# Patient Record
Sex: Male | Born: 1990 | Race: White | Hispanic: No | Marital: Single | Smoking: Never smoker
Health system: Southern US, Community
[De-identification: ages and names within clinical notes are randomized; demographics above are authoritative.]

## PROBLEM LIST (undated history)

## (undated) DIAGNOSIS — I10 Essential (primary) hypertension: Secondary | ICD-10-CM

## (undated) HISTORY — PX: APPENDECTOMY: SHX54

---
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
## 2012-02-06 ENCOUNTER — Emergency Department (HOSPITAL_BASED_OUTPATIENT_CLINIC_OR_DEPARTMENT_OTHER)
Admission: EM | Admit: 2012-02-06 | Discharge: 2012-02-06 | Disposition: A | Discharge status: Home / Self Care | Payer: PRIVATE HEALTH INSURANCE | Attending: Emergency Medicine | Admitting: Emergency Medicine

## 2012-02-06 ENCOUNTER — Encounter (HOSPITAL_BASED_OUTPATIENT_CLINIC_OR_DEPARTMENT_OTHER): Payer: Self-pay | Admitting: *Deleted

## 2012-02-06 DIAGNOSIS — T1590XA Foreign body on external eye, part unspecified, unspecified eye, initial encounter: Secondary | ICD-10-CM | POA: Insufficient documentation

## 2012-02-06 DIAGNOSIS — Z9089 Acquired absence of other organs: Secondary | ICD-10-CM | POA: Insufficient documentation

## 2012-02-06 DIAGNOSIS — S0500XA Injury of conjunctiva and corneal abrasion without foreign body, unspecified eye, initial encounter: Secondary | ICD-10-CM

## 2012-02-06 DIAGNOSIS — S058X9A Other injuries of unspecified eye and orbit, initial encounter: Secondary | ICD-10-CM | POA: Insufficient documentation

## 2012-02-06 DIAGNOSIS — Y9289 Other specified places as the place of occurrence of the external cause: Secondary | ICD-10-CM | POA: Insufficient documentation

## 2012-02-06 MED ORDER — OXYCODONE-ACETAMINOPHEN 5-325 MG PO TABS
2.0000 | ORAL_TABLET | Freq: Once | ORAL | Status: AC
  Administered 2012-02-06: 2 via ORAL
  Filled 2012-02-06 (×2): qty 2

## 2012-02-06 MED ORDER — TETRACAINE HCL 0.5 % OP SOLN
2.0000 [drp] | Freq: Once | OPHTHALMIC | Status: AC
  Administered 2012-02-06: 2 [drp] via OPHTHALMIC
  Filled 2012-02-06: qty 2

## 2012-02-06 MED ORDER — FLUORESCEIN SODIUM 1 MG OP STRP
1.0000 | ORAL_STRIP | Freq: Once | OPHTHALMIC | Status: AC
  Administered 2012-02-06: 1 via OPHTHALMIC
  Filled 2012-02-06 (×2): qty 1

## 2012-02-06 MED ORDER — OXYCODONE-ACETAMINOPHEN 5-325 MG PO TABS: 1.0000 | ORAL_TABLET | Freq: Four times a day (QID) | ORAL | Status: DC | PRN

## 2012-02-06 MED ORDER — CIPROFLOXACIN HCL 0.3 % OP SOLN
2.0000 [drp] | OPHTHALMIC | Status: DC
  Administered 2012-02-06: 2 [drp] via OPHTHALMIC

## 2012-02-06 MED ORDER — CIPROFLOXACIN HCL 0.3 % OP SOLN
OPHTHALMIC | Status: AC
  Filled 2012-02-06: qty 2.5

## 2012-02-06 NOTE — ED Notes (Signed)
Pt states he has a piece of glass or plastic in his left eye.

## 2012-02-06 NOTE — ED Provider Notes (Signed)
History  This chart was scribed for Edwin B. Bernette Mayers, MD by Shari Heritage. The patient was seen in room MH12/MH12. Patient's care was started at 2034.     CSN: [id number]  Arrival date & time 02/06/12  1958   First MD Initiated Contact with Patient 02/06/12 2034      Chief Complaint  Patient presents with  . Foreign Body in Eye     The history is provided by the patient. No language interpreter was used.    Edwin Holland is a 21 y.o. male who presents to the Emergency Department complaining of moderate, constant, non-radiating  left eye pain onset this morning when he woke up. 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 Patient states that there may be something in his eye. Patient also states that he may have been exposed to chemicals at work. Patient denies right eye pain or any other symptoms. Patient states that he and took some Ibuprofen a couple of hours ago and also washed his eye out with OTC solution before coming to the ED. He reports no significant past medical history.    History reviewed. No pertinent past medical history.  Past Surgical History  Procedure Date  . Appendectomy     History reviewed. No pertinent family history.  History  Substance Use Topics  . Smoking status: Never Smoker   . Smokeless tobacco: Not on file  . Alcohol Use: No      Review of Systems A complete 10 system review of systems was obtained and all systems are negative except as noted in the HPI and PMH.   Allergies  Review of patient's allergies indicates no known allergies.  Home Medications  No current outpatient prescriptions on file.  BP 144/80  Pulse 56  Temp 97.9 F (36.6 C) (Oral)  Resp 18  Ht 5\' 10"  (1.778 m)  Wt 223 lb (101.152 kg)  BMI 32.00 kg/m2  SpO2 100%  Physical Exam  Constitutional: He is oriented to person, place, and time. He appears well-developed and well-nourished.  HENT:  Head: Normocephalic and atraumatic.    Eyes: No foreign body present in the left eye. Left conjunctiva is injected.  Slit lamp exam:      The left eye shows corneal abrasion.       No foreign body seen on lid eversion of left eyelid. Diffuse punctate corneal abrasions to the left eye. Left conjunctival injection present.  Neck: Neck supple.  Pulmonary/Chest: Effort normal.  Neurological: He is alert and oriented to person, place, and time. No cranial nerve deficit.  Psychiatric: He has a normal mood and affect. His behavior is normal.    ED Course  Procedures (including critical care time) DIAGNOSTIC STUDIES: Oxygen Saturation is 100% on room air, normal by my interpretation.    COORDINATION OF CARE: 8:43pm- Patient informed of current plan for treatment and evaluation and agrees with plan at this time.      Labs Reviewed - No data to display No results found.   No diagnosis found.    MDM  No FB seen but several corneal abrasions seen. Treat with Abx and pain meds. Ophtho referral.      I personally performed the services described in the documentation, which were scribed in my presence. The recorded information has been reviewed and considered.     Edwin B. Bernette Mayers, MD 02/06/12 2340

## 2012-02-06 NOTE — ED Notes (Signed)
Patient states he went to sleep last night with no trouble, awoke today with object in eye, that it may have fallen from his hair.

## 2012-02-06 NOTE — ED Notes (Signed)
Patient unable to open eyes for visual acuity at present.

## 2021-04-28 ENCOUNTER — Ambulatory Visit: Payer: Self-pay | Admitting: *Deleted

## 2021-04-28 NOTE — Telephone Encounter (Signed)
I returned pt's call.   He called in earlier today.  He is a Microbiologist and moves around a lot.  His current PCP may not give another supply of Losartan 50 mg daily because he has already been given 2 90 day supplies.   He is in [place] for 5 months.   Should he schedule a new pt appt or can he go to the urgent care and they prescribe the Losartan.   He only has 14 pills left.  He moved 6 months ago to [place].     I move constantly.   I live in different counties.  His PCP in in [place].  He won't be back in [facility] until May of 2025.  I suggested he call his PCP that has been ordering the Losartan and see if he will prescribe him another 90 days and if not then go to the urgent care.   "I know I have a weird and different situation".      Reason for Disposition  Health Information question, no triage required and triager able to answer question  Answer Assessment - Initial Assessment Questions 1. REASON FOR CALL or QUESTION: "What is your reason for calling today?" or "How can I best help you?" or "What question do you have that I can help answer?"     See documentation notes  Protocols used: Information Only Call - No Triage-A-AH

## 2021-05-01 ENCOUNTER — Other Ambulatory Visit: Payer: Self-pay

## 2021-05-01 ENCOUNTER — Emergency Department: Payer: PRIVATE HEALTH INSURANCE

## 2021-05-01 ENCOUNTER — Emergency Department
Admission: EM | Admit: 2021-05-01 | Discharge: 2021-05-02 | Disposition: A | Discharge status: Home / Self Care | Payer: PRIVATE HEALTH INSURANCE | Attending: Emergency Medicine | Admitting: Emergency Medicine

## 2021-05-01 DIAGNOSIS — I10 Essential (primary) hypertension: Secondary | ICD-10-CM | POA: Insufficient documentation

## 2021-05-01 DIAGNOSIS — Z79899 Other long term (current) drug therapy: Secondary | ICD-10-CM | POA: Insufficient documentation

## 2021-05-01 DIAGNOSIS — R002 Palpitations: Secondary | ICD-10-CM | POA: Insufficient documentation

## 2021-05-01 DIAGNOSIS — R0789 Other chest pain: Secondary | ICD-10-CM | POA: Insufficient documentation

## 2021-05-01 LAB — BASIC METABOLIC PANEL
Anion gap: 12 (ref 5–15)
BUN: 16 mg/dL (ref 6–20)
CO2: 23 mmol/L (ref 22–32)
Calcium: 9.4 mg/dL (ref 8.9–10.3)
Chloride: 102 mmol/L (ref 98–111)
Creatinine, Ser: 0.97 mg/dL (ref 0.61–1.24)
GFR, Estimated: >60 mL/min (ref >60)
Glucose, Bld: 112 mg/dL — ABNORMAL HIGH (ref 70–99)
Potassium: 3.5 mmol/L (ref 3.5–5.1)
Sodium: 137 mmol/L (ref 135–145)

## 2021-05-01 LAB — CBC
HCT: 44.2 % (ref 39.0–52.0)
Hemoglobin: 14.6 g/dL (ref 13.0–17.0)
MCH: 27.7 pg (ref 26.0–34.0)
MCHC: 33 g/dL (ref 30.0–36.0)
MCV: 83.7 fL (ref 80.0–100.0)
Platelets: 274 10*3/uL (ref 150–400)
RBC: 5.28 MIL/uL (ref 4.22–5.81)
RDW: 13 % (ref 11.5–15.5)
WBC: 7.4 10*3/uL (ref 4.0–10.5)
nRBC: 0 % (ref 0.0–0.2)

## 2021-05-01 LAB — MAGNESIUM: Magnesium: 2.1 mg/dL (ref 1.7–2.4)

## 2021-05-01 LAB — TROPONIN I (HIGH SENSITIVITY): Troponin I (High Sensitivity): 3 ng/L (ref <18)

## 2021-05-01 LAB — TSH: TSH: 5.448 u[IU]/mL — ABNORMAL HIGH (ref 0.350–4.500)

## 2021-05-01 LAB — T4, FREE: Free T4: 1.03 ng/dL (ref 0.61–1.12)

## 2021-05-01 NOTE — ED Provider Triage Note (Signed)
Emergency Medicine Provider Triage Evaluation Note  Edwin Holland , a 31 y.o. male  was evaluated in triage.  Pt complains of hypertension and tachycardia intermittent for the past 2 weeks. Symptoms worsen when on social media and hearing of young people dying from cardiac issues. He denies cardiac history.  Review of Systems  Positive: Palpitations  Negative: Shortness of breath  Physical Exam  There were no vitals taken for this visit. Gen:   Awake, no distress   Resp:  Normal effort  MSK:   Moves extremities without difficulty  Other:    Medical Decision Making  Medically screening exam initiated at 2:50 PM.  Appropriate orders placed.  Edwin Holland was informed that the remainder of the evaluation will be completed by another provider, this initial triage assessment does not replace that evaluation, and the importance of remaining in the ED until their evaluation is complete.    Chinita Pester, FNP 05/01/21 1554

## 2021-05-01 NOTE — ED Triage Notes (Signed)
Pt states that he has been having racing heart rate for the past couple weeks, states that he has become more anxious with seeing all the young people dying recently and is concerned about his bp and his elevated heart rate. Pt states that is having different sensations around his heart and some center back pain and the feeling to stretch his chest out

## 2021-05-01 NOTE — Discharge Instructions (Addendum)
Steps to find a Primary Care Provider (PCP):  Call [phone] or [phone] to access "[place] Find a Doctor Service."  2.  You may also go on the [place] website at [email or web address]  

## 2021-05-01 NOTE — ED Provider Notes (Signed)
[facility] Provider Note    Event Date/Time   First MD Initiated Contact with Patient 05/01/21 2340     (approximate)   History   No chief complaint on file.   HPI  Edwin Holland is a 31 y.o. male with history of hypertension on losartan who presents to the emergency department with intermittent left-sided chest tightness that has been ongoing for the past several months.  He also reports intermittent palpitations and feels like his heart is racing.  He states he has a twin brother who has had identical symptoms.  He states at times he will feel short of breath.  He does not notice any aggravating or alleviating factors.  He denies any recent fevers, cough, vomiting, diarrhea, bloody stools or melena.  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  He does have an appointment with a new primary care doctor on March 2.  Has never seen a cardiologist.  No history of PE, DVT, exogenous estrogen use, recent fractures, surgery, trauma, hospitalization, prolonged travel or other immobilization. No lower extremity swelling or pain. No calf tenderness.  History provided by patient.       No past medical history on file.  Past Surgical History:  Procedure Laterality Date   APPENDECTOMY      MEDICATIONS:  Prior to Admission medications   Medication Sig Start Date End Date Taking? Authorizing Provider  oxyCODONE-acetaminophen (PERCOCET/ROXICET) 5-325 MG per tablet Take 1-2 tablets by mouth every 6 (six) hours as needed for pain. 02/06/12   Pollyann Savoy, MD    Physical Exam   Triage Vital Signs: ED Triage Vitals  Enc Vitals Group     BP 05/01/21 1455 (!) 136/102     Pulse Rate 05/01/21 1455 (!) 118     Resp 05/01/21 1455 16     Temp 05/01/21 1455 98.7 F (37.1 C)     Temp Source 05/01/21 1455 Oral     SpO2 05/01/21 1455 100 %     Weight  05/01/21 1502 281 lb (127.5 kg)     Height 05/01/21 1502 5\' 10"  (1.778 m)     Head Circumference --      Peak Flow --      Pain Score 05/01/21 1502 5     Pain Loc --      Pain Edu? --      Excl. in GC? --     Most recent vital signs: Vitals:   05/02/21 0100 05/02/21 0200  BP: 122/73 114/75  Pulse: 84 93  Resp: (!) 23 14  Temp:    SpO2: 96% 96%    CONSTITUTIONAL: Alert and oriented and responds appropriately to questions. Well-appearing; well-nourished HEAD: Normocephalic, atraumatic EYES: Conjunctivae clear, pupils appear equal, sclera nonicteric ENT: normal nose; moist mucous membranes NECK: Supple, normal ROM CARD: RRR; S1 and S2 appreciated; no murmurs, no clicks, no rubs, no gallops RESP: Normal chest excursion without splinting or tachypnea; breath sounds clear and equal bilaterally; no wheezes, no rhonchi, no rales, no hypoxia or respiratory distress, speaking full sentences ABD/GI: Normal bowel sounds; non-distended; soft, non-tender, no rebound, no guarding, no peritoneal signs BACK: The back appears normal EXT: Normal ROM in all joints; no deformity noted, no edema; no cyanosis, no calf tenderness or calf swelling SKIN: Normal color for age and race; warm; no rash on exposed skin NEURO: Moves all extremities  equally, normal speech PSYCH: The patient's mood and manner are appropriate.   ED Results / Procedures / Treatments   LABS: (all labs ordered are listed, but only abnormal results are displayed) Labs Reviewed  BASIC METABOLIC PANEL - Abnormal; Notable for the following components:      Result Value   Glucose, Bld 112 (*)    All other components within normal limits  TSH - Abnormal; Notable for the following components:   TSH 5.448 (*)    All other components within normal limits  CBC  T4, FREE  MAGNESIUM  D-DIMER, QUANTITATIVE (NOT AT [facility])  TROPONIN I (HIGH SENSITIVITY)  TROPONIN I (HIGH SENSITIVITY)     EKG:  EKG  Interpretation  Date/Time:  Friday May 01 2021 15:00:15 EST Ventricular Rate:  116 PR Interval:  134 QRS Duration: 86 QT Interval:  372 QTC Calculation: 517 R Axis:   65 Text Interpretation: Sinus tachycardia Nonspecific ST and T wave abnormality Abnormal ECG When compared with ECG of 11-Jan-2003 08:33, PREVIOUS ECG IS PRESENT Confirmed by Rochele RaringWard, Makeshia Seat [phone]([id number]) on 05/01/2021 11:51:20 PM         RADIOLOGY: My personal review and interpretation of imaging: Chest x-ray clear.  I have personally reviewed all radiology reports.   DG Chest 2 View  Result Date: 05/01/2021 CLINICAL DATA:  Racing heart for couple weeks, anxiety, elevated blood pressure EXAM: CHEST - 2 VIEW COMPARISON:  03/05/2014 FINDINGS: Normal heart size, mediastinal contours, and pulmonary vascularity. Lungs clear. No pleural effusion or pneumothorax. Bones unremarkable. IMPRESSION: Normal exam. Electronically Signed   By: Ulyses SouthwardMark  Boles M.D.   On: 05/01/2021 15:30     PROCEDURES:  Critical Care performed: No      .1-3 Lead EKG Interpretation Performed by: Alasha Mcguinness, Layla MawKristen N, DO Authorized by: Marciana Uplinger, Layla MawKristen N, DO     Interpretation: normal     ECG rate:  88   ECG rate assessment: normal     Rhythm: sinus rhythm     Ectopy: none     Conduction: normal      IMPRESSION / MDM / ASSESSMENT AND PLAN / ED COURSE  I reviewed the triage vital signs and the nursing notes.    Patient here with intermittent chest pain and palpitations with shortness of breath for several months.  The patient is on the cardiac monitor to evaluate for evidence of arrhythmia and/or significant heart rate changes.   DIFFERENTIAL DIAGNOSIS (includes but not limited to):   ACS, PE, dissection, pneumonia, pneumothorax, PVCs, SVT, A. fib, a flutter, V. tach, musculoskeletal chest pain, anxiety, thyroid dysfunction, electrolyte abnormality, anemia, dehydration   PLAN: Cardiac labs, electrolytes, EKG, chest x-ray, D-dimer, cardiac  monitoring.   MEDICATIONS GIVEN IN ED: Medications - No data to display   ED COURSE: Patient's work-up here has been unrevealing.  No events noted on cardiac monitoring.  EKG is nonischemic and shows no LVH, prolonged intervals abnormal intervals, ischemia, arrhythmia, delta wave, Brugada.  Troponin x2 negative.  D-dimer negative.  TSH elevated but free T4 is normal.  Electrolytes within normal limits.  Normal hemoglobin.  Chest x-ray reviewed by myself and radiology shows no acute abnormality.  Symptoms may be related to anxiety.  He seems reassured once his work-up has come back unremarkable and his heart rate and blood pressure have improved.  I have recommended that he keep his appointment with primary care as scheduled in March but have also given him cardiology follow-up.  Recommended increase fluid intake, decrease caffeine intake.  He  denies any stimulant use.  Given he is well-appearing, hemodynamically stable with normal cardiac work-up, I do not feel he needs admission to the hospital at this time.  Patient is comfortable with this plan.   CONSULTS: No hospitalization required at this time given reassuring cardiac work-up and no significant risk factors for ACS, PE, dissection.   OUTSIDE RECORDS REVIEWED: Reviewed his most recent family practice notes with his doctor at [facility] on 07/17/2020 and 04/05/2021.  He had a normal TSH of 2.39 in February.        FINAL CLINICAL IMPRESSION(S) / ED DIAGNOSES   Final diagnoses:  Atypical chest pain  Palpitations     Rx / DC Orders   ED Discharge Orders     None        Note:  This document was prepared using Dragon voice recognition software and may include unintentional dictation errors.   Nelton Amsden, Layla Maw, DO 05/02/21 [phone]

## 2021-05-02 LAB — D-DIMER, QUANTITATIVE: D-Dimer, Quant: <0.27 ug/mL-FEU (ref 0.00–0.50)

## 2021-05-02 LAB — TROPONIN I (HIGH SENSITIVITY): Troponin I (High Sensitivity): 4 ng/L (ref <18)

## 2021-06-16 ENCOUNTER — Other Ambulatory Visit: Payer: Self-pay

## 2021-06-16 ENCOUNTER — Ambulatory Visit
Admission: RE | Admit: 2021-06-16 | Discharge: 2021-06-16 | Disposition: A | Discharge status: Home / Self Care | Payer: Self-pay | Source: Ambulatory Visit | Attending: Emergency Medicine | Admitting: Emergency Medicine

## 2021-06-16 VITALS — BP 125/88 | HR 98 | Temp 98.9°F | Resp 18

## 2021-06-16 DIAGNOSIS — Z760 Encounter for issue of repeat prescription: Secondary | ICD-10-CM

## 2021-06-16 DIAGNOSIS — I10 Essential (primary) hypertension: Secondary | ICD-10-CM

## 2021-06-16 HISTORY — DX: Essential (primary) hypertension: I10

## 2021-06-16 MED ORDER — LOSARTAN POTASSIUM 50 MG PO TABS: 50.0000 mg | ORAL_TABLET | Freq: Every day | ORAL | 2 refills | Status: AC

## 2021-06-16 NOTE — ED Provider Notes (Signed)
HPI  SUBJECTIVE:  Edwin Holland is a 31 y.o. male who presents for refill of his losartan 50 mg.  States he had an appointment to establish care with a primary care provider last week, but was told that the provider has left the practice, and that the practice was not taking any new patients.  He has no follow-up scheduled.  He states that his blood pressure has been well controlled with the losartan.  He denies any side effects.  Has 3 pills left.  He currently has no complaints.  He has a past medical history of hypertension and palpitations.  PCP: None.   Past Medical History:  Diagnosis Date   Hypertension     Past Surgical History:  Procedure Laterality Date   APPENDECTOMY      History reviewed. No pertinent family history.  Social History   Tobacco Use   Smoking status: Never   Smokeless tobacco: Never  Vaping Use   Vaping Use: Never used  Substance Use Topics   Alcohol use: No   Drug use: No    No current facility-administered medications for this encounter.  Current Outpatient Medications:    losartan (COZAAR) 50 MG tablet, Take 1 tablet (50 mg total) by mouth daily., Disp: 30 tablet, Rfl: 2  Allergies  Allergen Reactions   Benadryl [Diphenhydramine] Rash     ROS  As noted in HPI.   Physical Exam  BP 125/88 (BP Location: Left Arm)    Pulse 98    Temp 98.9 F (37.2 C) (Oral)    Resp 18    SpO2 96%   Constitutional: Well developed, well nourished, no acute distress Eyes:  EOMI, conjunctiva normal bilaterally HENT: Normocephalic, atraumatic,mucus membranes moist Respiratory: Normal inspiratory effort, lungs clear bilaterally Cardiovascular: Normal rate, regular rhythm, no murmurs, rubs, gallops GI: nondistended skin: No rash, skin intact Musculoskeletal: no deformities Neurologic: Alert & oriented x 3, no focal neuro deficits Psychiatric: Speech and behavior appropriate   ED Course   Medications - No data to display  No orders of the  defined types were placed in this encounter.   No results found for this or any previous visit (from the past 24 hour(s)). No results found.  ED Clinical Impression  1. Essential hypertension   2. Medication refill      ED Assessment/Plan  Patient normotensive today.  .  The losartan 50 mg is working well for him.  will give him 3 months worth in case he runs into any additional issues establishing care with a primary care provider.  Will provide primary care list and order assistance in finding a PCP.  Discussed  MDM, treatment plan, and plan for follow-up with patient.  patient agrees with plan.   Meds ordered this encounter  Medications   losartan (COZAAR) 50 MG tablet    Sig: Take 1 tablet (50 mg total) by mouth daily.    Dispense:  30 tablet    Refill:  2      *This clinic note was created using Scientist, clinical (histocompatibility and immunogenetics). Therefore, there may be occasional mistakes despite careful proofreading.  ?    Domenick Gong, MD 06/16/21 2107

## 2021-06-16 NOTE — Discharge Instructions (Addendum)
Keep a log of your blood pressure to make sure that it stays under good control.  I have given you 3 months worth of losartan in case you have issues establishing with a primary care provider.   Here is a list of primary care providers who are taking new patients:  [facility] [place] Dr. Rosette Reveal (also specializes in sports medicine) Dr. Otilio Miu [address] [place] [address] [place], [place] [address] [place] [address]  [phone]  [facility] [address] [place], [place] [address] [phone]  [facility] [place]  [phone] [place], [place] [address]  Here are clinics/ other resources who will see you if you do not have insurance. Some have certain criteria that you must meet. Call them and find out what they are:  [facility]: [address] [place] [address] [place], [place] [address] Phone: [phone] Hours: First and Third Saturdays of each Month, 9 a.m. - 1 p.m.  [facility]: [address] [place], [place], [place] [address] Phone: [phone] Hours: Tuesday, 4 p.m. - 8 p.m. Thursday, 1 p.m. - 8 p.m. Wednesday, 9 a.m. - [facility] [address] [place], [place] [address] Phone: [phone] Pharmacy Phone Number: [phone] Dental Phone Number: [phone] [place] Help: [phone]  Dental Hours: Monday - Thursday, 8 a.m. - 6 p.m.  [place] [address], [place], [place] [address] Phone: [phone] Pharmacy Phone Number: [phone] [facility] Insurance Help: [phone]  [facility] [address] [place], [place] [address] Phone: [phone] Pharmacy Phone Number: [phone] [facility] Insurance Help: [phone]  [facility] [address] [place], [place] [address] Phone:  [phone] [facility] Insurance Help: [phone]   [address] [place], [place] [address] Phone: [phone]  Go to www.goodrx.com  or www.costplusdrugs.com to look up your medications. This will give you a list of where you can find your prescriptions at the most affordable prices. Or ask the pharmacist what the cash price is, or if they have any other discount programs available to help make your medication more affordable. This can be less expensive than what you would pay with insurance.

## 2021-06-16 NOTE — ED Triage Notes (Signed)
Pt needs a refill on his BP medication. He had an appt scheduled today and it was rescheduled.

## 2021-06-17 ENCOUNTER — Encounter (HOSPITAL_COMMUNITY): Payer: Self-pay

## 2021-06-18 ENCOUNTER — Ambulatory Visit: Payer: PRIVATE HEALTH INSURANCE | Admitting: Adult Health

## 2021-08-26 ENCOUNTER — Emergency Department
Admission: EM | Admit: 2021-08-26 | Discharge: 2021-08-27 | Disposition: A | Discharge status: Home / Self Care | Payer: Self-pay | Attending: Emergency Medicine | Admitting: Emergency Medicine

## 2021-08-26 ENCOUNTER — Other Ambulatory Visit: Payer: Self-pay

## 2021-08-26 ENCOUNTER — Encounter: Payer: Self-pay | Admitting: Emergency Medicine

## 2021-08-26 DIAGNOSIS — X58XXXA Exposure to other specified factors, initial encounter: Secondary | ICD-10-CM | POA: Insufficient documentation

## 2021-08-26 DIAGNOSIS — T162XXA Foreign body in left ear, initial encounter: Secondary | ICD-10-CM | POA: Insufficient documentation

## 2021-08-26 MED ORDER — NEOMYCIN-POLYMYXIN-HC 3.5-10000-1 OT SOLN
3.0000 [drp] | Freq: Three times a day (TID) | OTIC | 0 refills | Status: AC
Start: 2021-08-26 — End: 2021-09-02

## 2021-08-26 MED ORDER — LIDOCAINE VISCOUS HCL 2 % MT SOLN
15.0000 mL | Freq: Once | OROMUCOSAL | Status: DC
  Filled 2021-08-26: qty 15

## 2021-08-26 NOTE — ED Provider Notes (Signed)
? ?[facility] ?Provider Note ? ?Patient Contact: 11:27 PM (approximate) ? ? ?History  ? ?Foreign Body in Ear ? ? ?HPI ? ?Edwin Holland is a 31 y.o. male who presents the emergency department complaining of foreign body in the left ear.  Patient states that he was watching TV when he felt a bug fly into his left ear.  He can still feel it moving around in the left ear.  No other complaints.  Patient has not attempted removal prior to arrival. ?  ? ? ?Physical Exam  ? ?Triage Vital Signs: ?ED Triage Vitals  ?Enc Vitals Group  ?   BP --   ?   Pulse --   ?   Resp --   ?   Temp --   ?   Temp src --   ?   SpO2 --   ?   Weight 08/26/21 2308 280 lb (127 kg)  ?   Height 08/26/21 2308 5\' 10"  (1.778 m)  ?   Head Circumference --   ?   Peak Flow --   ?   Pain Score 08/26/21 2307 0  ?   Pain Loc --   ?   Pain Edu? --   ?   Excl. in GC? --   ? ? ?Most recent vital signs: ?There were no vitals filed for this visit. ? ? ?General: Alert and in no acute distress. ?ENT: ?     Ears: Visualization of the left EAC revealed a retained living bug which appears to be a moth.  After successful removal, no residual insect is identified in the [facility].  There is no trauma to the TM. ?     Nose: No congestion/rhinnorhea. ?     Mouth/Throat: Mucous membranes are moist. ?Cardiovascular:  Good peripheral perfusion ?Respiratory: Normal respiratory effort without tachypnea or retractions. Lungs CTAB.  ?Musculoskeletal: Full range of motion to all extremities.  ?Neurologic:  No gross focal neurologic deficits are appreciated.  ?Skin:   No rash noted ?Other: ? ? ?ED Results / Procedures / Treatments  ? ?Labs ?(all labs ordered are listed, but only abnormal results are displayed) ?Labs Reviewed - No data to display ? ? ?EKG ? ? ? ? ?RADIOLOGY ? ? ? ?No results found. ? ?PROCEDURES: ? ?Critical Care performed: No ? ?.Foreign Body Removal ? ?Date/Time: 08/26/2021 11:32 PM ?Performed by: 10/26/2021, PA-C ?Authorized by:  Racheal Patches, PA-C  ?Consent: Verbal consent obtained. ?Risks and benefits: risks, benefits and alternatives were discussed ?Consent given by: patient ?Patient understanding: patient states understanding of the procedure being performed ?Imaging studies: imaging studies available ?Required items: required blood products, implants, devices, and special equipment available ?Patient identity confirmed: verbally with patient ?Time out: Immediately prior to procedure a "time out" was called to verify the correct patient, procedure, equipment, support staff and site/side marked as required. ?Body area: ear ?Location details: left ear ? ?Sedation: ?Patient sedated: no ? ?Patient restrained: no ?Patient cooperative: yes ?Localization method: ENT speculum ?Removal mechanism: alligator forceps and irrigation ?Complexity: simple ?1 objects recovered. ?Objects recovered: moth ?Post-procedure assessment: foreign body removed ?Patient tolerance: patient tolerated the procedure well with no immediate complications ? ? ? ?MEDICATIONS ORDERED IN ED: ?Medications  ?lidocaine (XYLOCAINE) 2 % viscous mouth solution 15 mL (has no administration in time range)  ? ? ? ?IMPRESSION / MDM / ASSESSMENT AND PLAN / ED COURSE  ?I reviewed the triage vital signs and the nursing notes. ?             ?               ? ?  Differential diagnosis includes, but is not limited to, ear foreign body, otitis externa, TM perforation, feared complaint without diagnosis ? ? ? ?Patient's diagnosis is consistent with your foreign body.  Patient presented to the emergency department with a potential foreign body in the left ear.  He states he was watching TV when a bug flew in his ear.  Visualization confirmed that he did have what appeared to be a moth like insect in the [facility] itself.  Unable to tolerate extraction with alligator forceps initially but strong irrigation was able to dislodge foreign body.  No retained foreign body in the EAC..  Given the  nature of the complaint I will place the patient on antibiotic eardrops.  Follow-up with ENT as needed.  Patient is given ED precautions to return to the ED for any worsening or new symptoms. ? ? ? ?  ? ? ?FINAL CLINICAL IMPRESSION(S) / ED DIAGNOSES  ? ?Final diagnoses:  ?Foreign body of left ear, initial encounter  ? ? ? ?Rx / DC Orders  ? ?ED Discharge Orders   ? ?      Ordered  ?  neomycin-polymyxin-hydrocortisone (CORTISPORIN) OTIC solution  3 times daily       ? 08/26/21 2333  ? ?  ?  ? ?  ? ? ? ?Note:  This document was prepared using Dragon voice recognition software and may include unintentional dictation errors. ?  ?Racheal Patches, PA-C ?08/26/21 2334 ? ?  ?Georga Hacking, MD ?08/28/21 1608 ? ?

## 2021-08-26 NOTE — ED Notes (Signed)
Provider to triage to assess °

## 2021-08-26 NOTE — ED Triage Notes (Signed)
Patient ambulatory to triage with steady gait, without difficulty or distress noted; pt reports moth flew in left ear PTA ?

## 2023-06-08 IMAGING — CR DG CHEST 2V
1 series · 2 of 2 positions shown · non-contrast
Comparison: 03/05/2014

CLINICAL DATA: Racing heart for couple weeks, anxiety, elevated
blood pressure

EXAM:
CHEST - 2 VIEW

[Series 1: dg chest 2 view · 0.14mm/px · 2 of 2 slices shown]
[im 1/2]
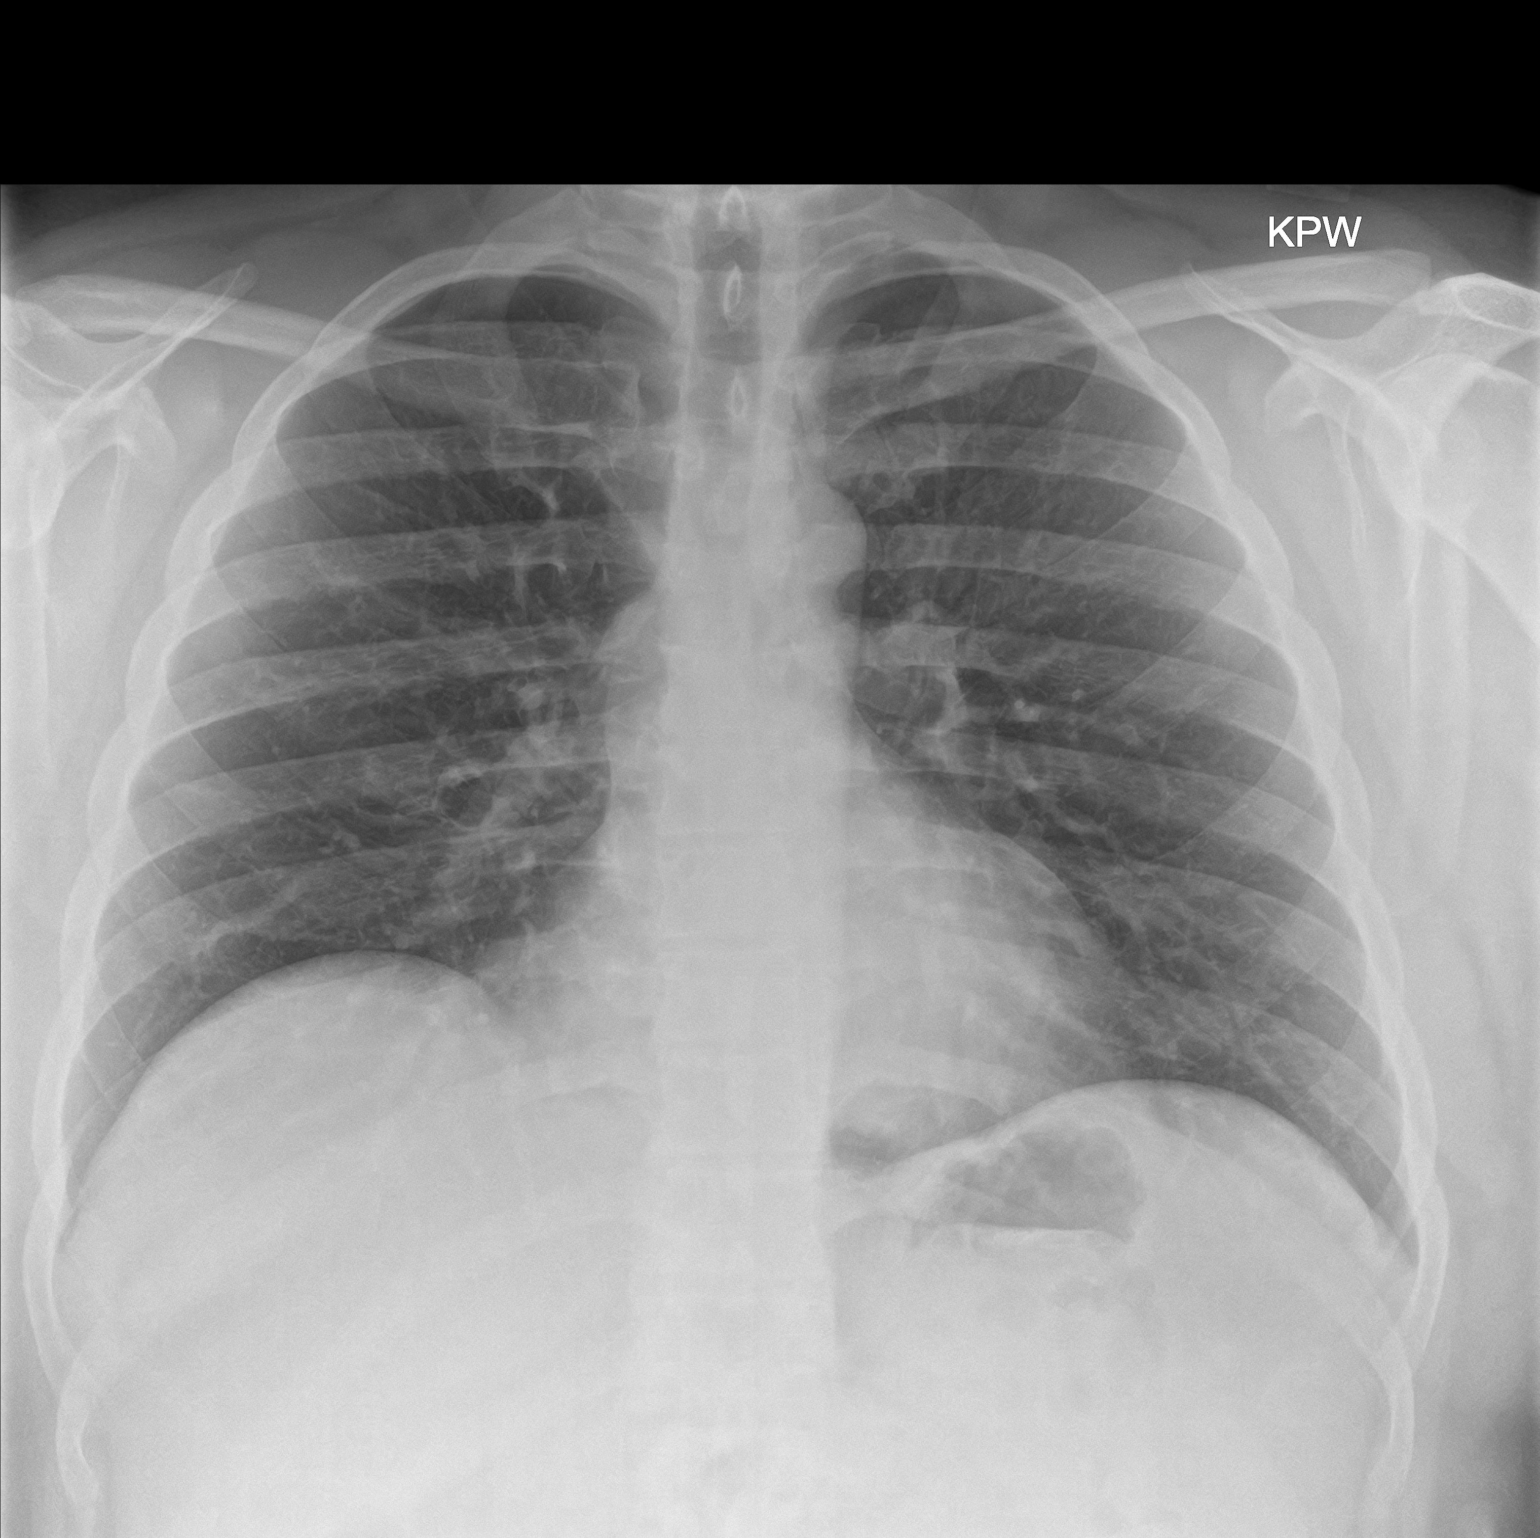
[im 2/2]
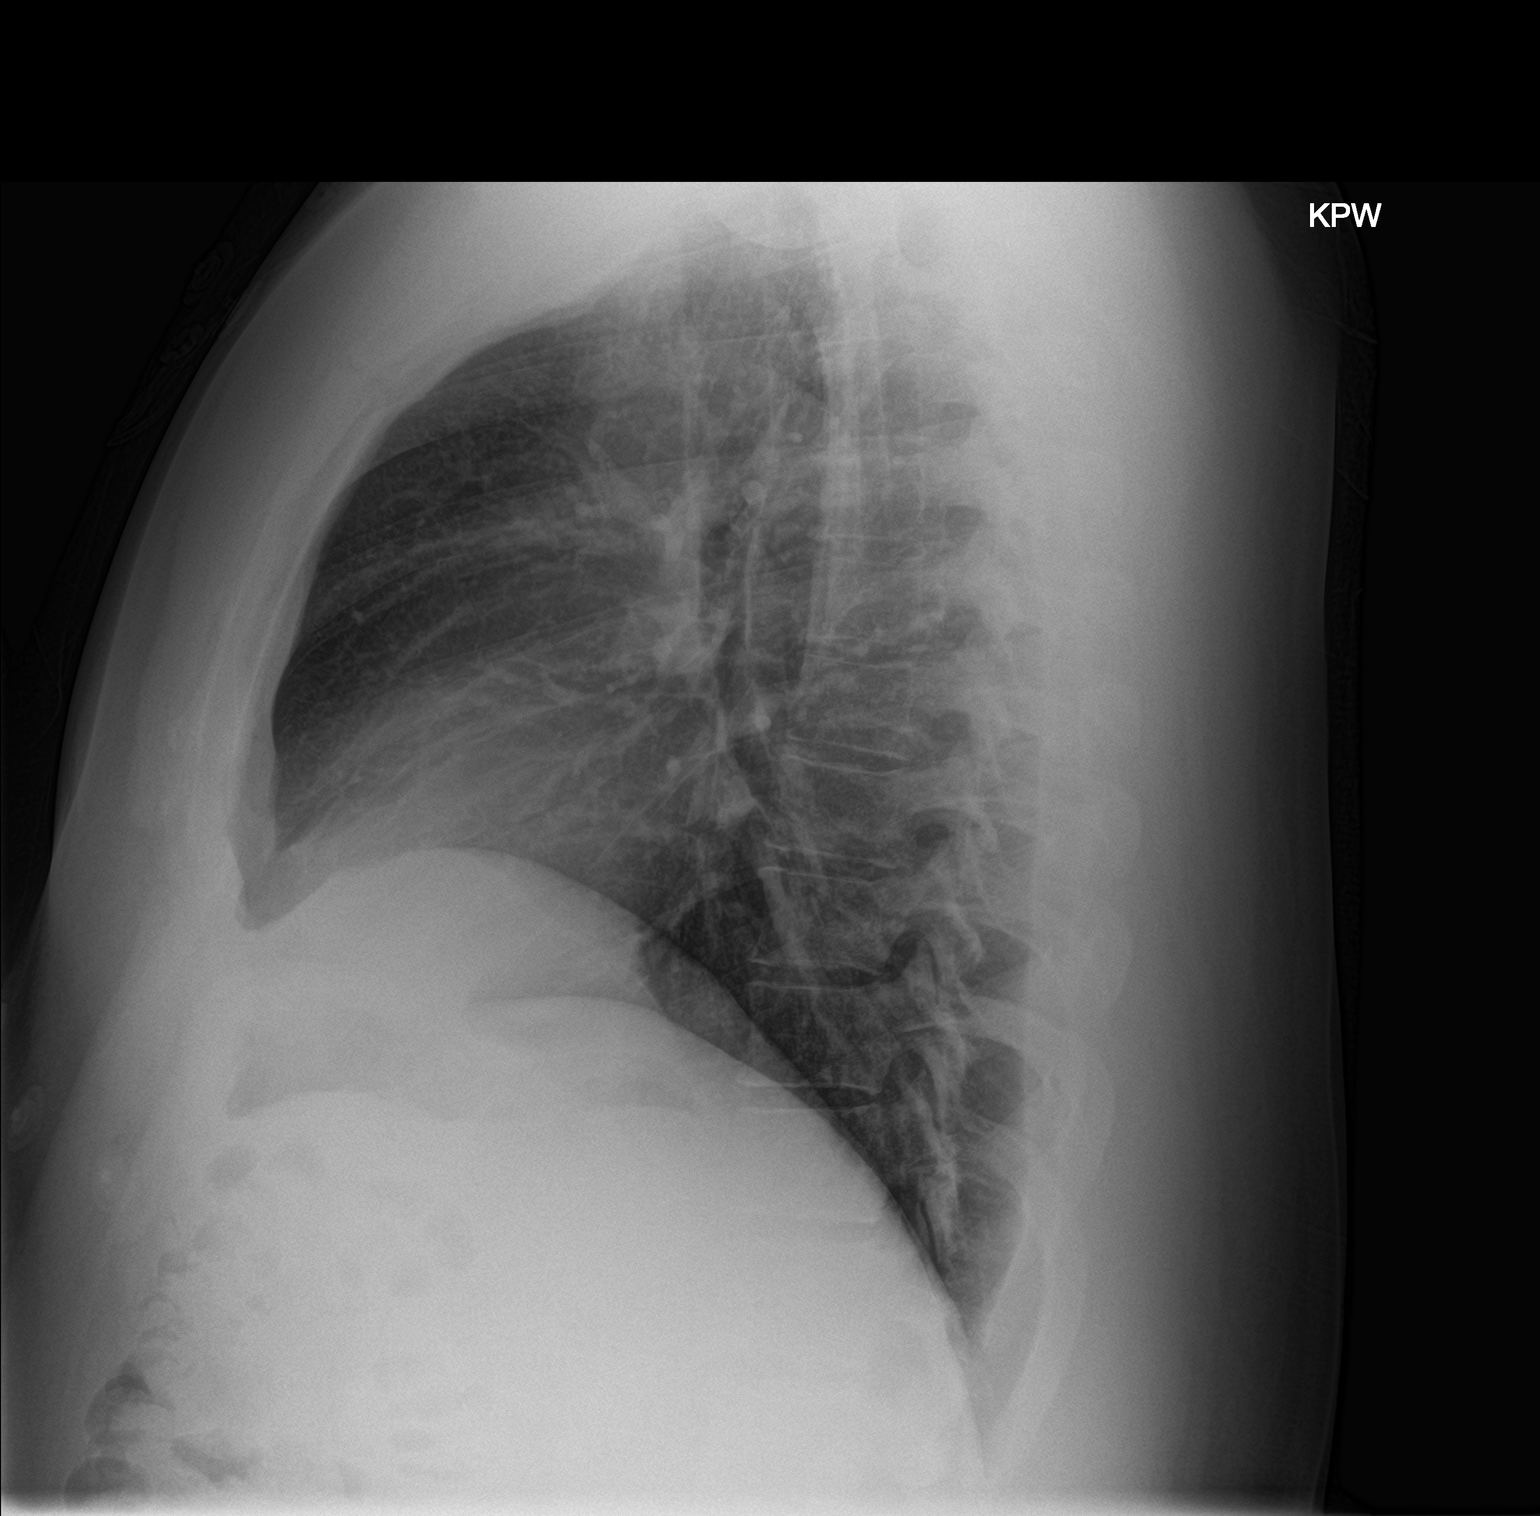

[2 of 2 positions shown; findings below may reference images not displayed]

FINDINGS: Normal heart size, mediastinal contours, and pulmonary vascularity.

Lungs clear.

No pleural effusion or pneumothorax.

Bones unremarkable.
IMPRESSION: Normal exam.
